# Patient Record
Sex: Female | Born: 1960 | Race: White | Hispanic: No | Marital: Married | Smoking: Never smoker
Health system: Southern US, Community
[De-identification: ages and names within clinical notes are randomized; demographics above are authoritative.]

## PROBLEM LIST (undated history)

## (undated) DIAGNOSIS — D649 Anemia, unspecified: Secondary | ICD-10-CM

## (undated) DIAGNOSIS — E669 Obesity, unspecified: Secondary | ICD-10-CM

## (undated) DIAGNOSIS — J45909 Unspecified asthma, uncomplicated: Secondary | ICD-10-CM

## (undated) DIAGNOSIS — I10 Essential (primary) hypertension: Secondary | ICD-10-CM

## (undated) DIAGNOSIS — K635 Polyp of colon: Secondary | ICD-10-CM

## (undated) DIAGNOSIS — E039 Hypothyroidism, unspecified: Secondary | ICD-10-CM

## (undated) DIAGNOSIS — K589 Irritable bowel syndrome without diarrhea: Secondary | ICD-10-CM

## (undated) DIAGNOSIS — K5790 Diverticulosis of intestine, part unspecified, without perforation or abscess without bleeding: Secondary | ICD-10-CM

## (undated) DIAGNOSIS — K8020 Calculus of gallbladder without cholecystitis without obstruction: Secondary | ICD-10-CM

## (undated) HISTORY — DX: Unspecified asthma, uncomplicated: J45.909

## (undated) HISTORY — DX: Diverticulosis of intestine, part unspecified, without perforation or abscess without bleeding: K57.90

## (undated) HISTORY — DX: Obesity, unspecified: E66.9

## (undated) HISTORY — PX: UMBILICAL HERNIA REPAIR: SHX196

## (undated) HISTORY — DX: Polyp of colon: K63.5

## (undated) HISTORY — DX: Essential (primary) hypertension: I10

## (undated) HISTORY — DX: Anemia, unspecified: D64.9

## (undated) HISTORY — DX: Irritable bowel syndrome, unspecified: K58.9

## (undated) HISTORY — PX: OTHER SURGICAL HISTORY: SHX169

## (undated) HISTORY — DX: Calculus of gallbladder without cholecystitis without obstruction: K80.20

## (undated) HISTORY — DX: Hypothyroidism, unspecified: E03.9

## (undated) HISTORY — PX: ETHMOIDECTOMY: SHX5197

---

## 1997-08-02 HISTORY — PX: CHOLECYSTECTOMY: SHX55

## 1997-08-02 HISTORY — PX: COLONOSCOPY: SHX174

## 1999-08-03 HISTORY — PX: GASTRIC BYPASS: SHX52

## 2001-08-02 HISTORY — PX: ABDOMINAL HYSTERECTOMY: SHX81

## 2002-08-02 HISTORY — PX: OTHER SURGICAL HISTORY: SHX169

## 2004-08-02 HISTORY — PX: OTHER SURGICAL HISTORY: SHX169

## 2008-03-22 ENCOUNTER — Emergency Department (HOSPITAL_COMMUNITY): Admission: EM | Admit: 2008-03-22 | Discharge: 2008-03-22 | Payer: Self-pay | Admitting: Emergency Medicine

## 2010-09-04 ENCOUNTER — Emergency Department (HOSPITAL_COMMUNITY)
Admission: EM | Admit: 2010-09-04 | Discharge: 2010-09-04 | Disposition: A | Discharge status: Home / Self Care | Payer: PRIVATE HEALTH INSURANCE | Attending: Emergency Medicine | Admitting: Emergency Medicine

## 2010-09-04 ENCOUNTER — Emergency Department (HOSPITAL_COMMUNITY)
Admit: 2010-09-04 | Discharge: 2010-09-04 | Disposition: A | Discharge status: Home / Self Care | Payer: PRIVATE HEALTH INSURANCE

## 2010-09-04 ENCOUNTER — Encounter (HOSPITAL_COMMUNITY): Payer: Self-pay

## 2010-09-04 DIAGNOSIS — M25579 Pain in unspecified ankle and joints of unspecified foot: Secondary | ICD-10-CM | POA: Insufficient documentation

## 2010-09-04 DIAGNOSIS — M545 Low back pain, unspecified: Secondary | ICD-10-CM | POA: Insufficient documentation

## 2010-09-04 DIAGNOSIS — M1710 Unilateral primary osteoarthritis, unspecified knee: Secondary | ICD-10-CM | POA: Insufficient documentation

## 2010-09-04 DIAGNOSIS — M25569 Pain in unspecified knee: Secondary | ICD-10-CM | POA: Insufficient documentation

## 2010-09-04 DIAGNOSIS — M25519 Pain in unspecified shoulder: Secondary | ICD-10-CM | POA: Insufficient documentation

## 2010-09-04 DIAGNOSIS — IMO0002 Reserved for concepts with insufficient information to code with codable children: Secondary | ICD-10-CM | POA: Insufficient documentation

## 2010-09-04 DIAGNOSIS — E039 Hypothyroidism, unspecified: Secondary | ICD-10-CM | POA: Insufficient documentation

## 2010-09-04 DIAGNOSIS — Z9889 Other specified postprocedural states: Secondary | ICD-10-CM | POA: Insufficient documentation

## 2010-09-04 DIAGNOSIS — M25469 Effusion, unspecified knee: Secondary | ICD-10-CM | POA: Insufficient documentation

## 2010-09-04 DIAGNOSIS — Z79899 Other long term (current) drug therapy: Secondary | ICD-10-CM | POA: Insufficient documentation

## 2012-01-16 IMAGING — CR DG LUMBAR SPINE COMPLETE 4+V
5 series · 5 of 5 positions shown · non-contrast
Comparison: None.

CLINICAL DATA: Low back pain

LUMBAR SPINE - COMPLETE 4+ VIEW

[view not recorded (1 of 5)]
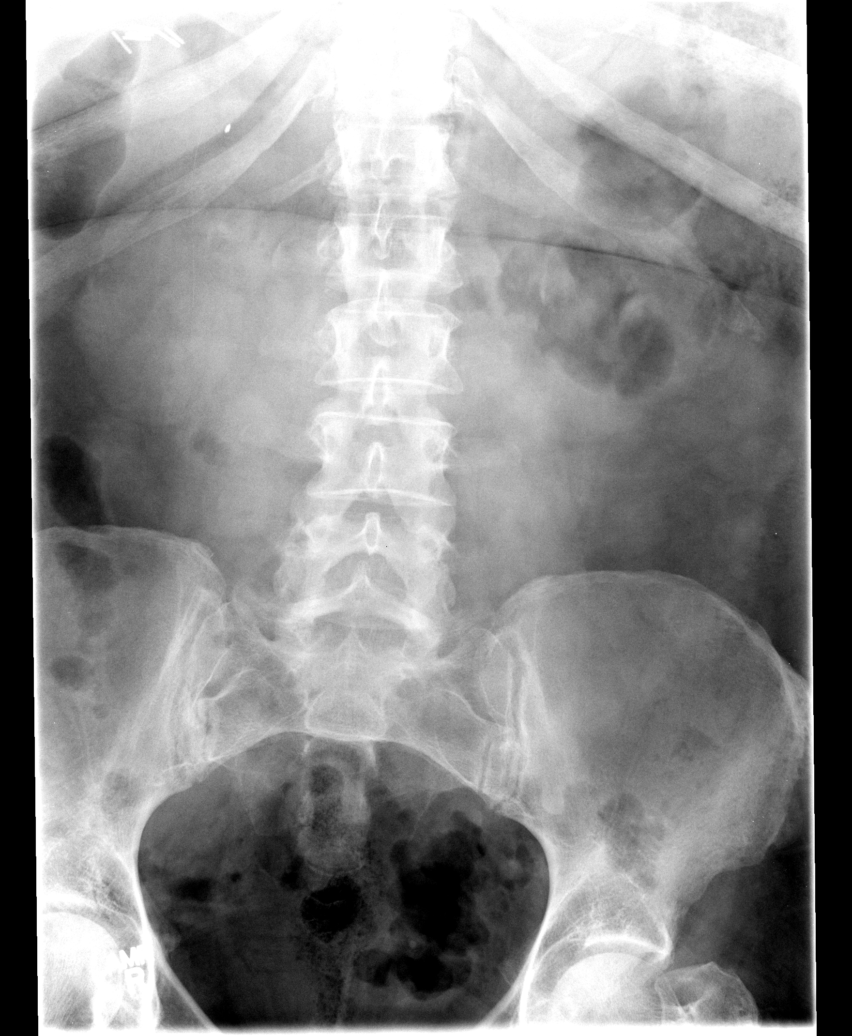

[view not recorded (2 of 5)]
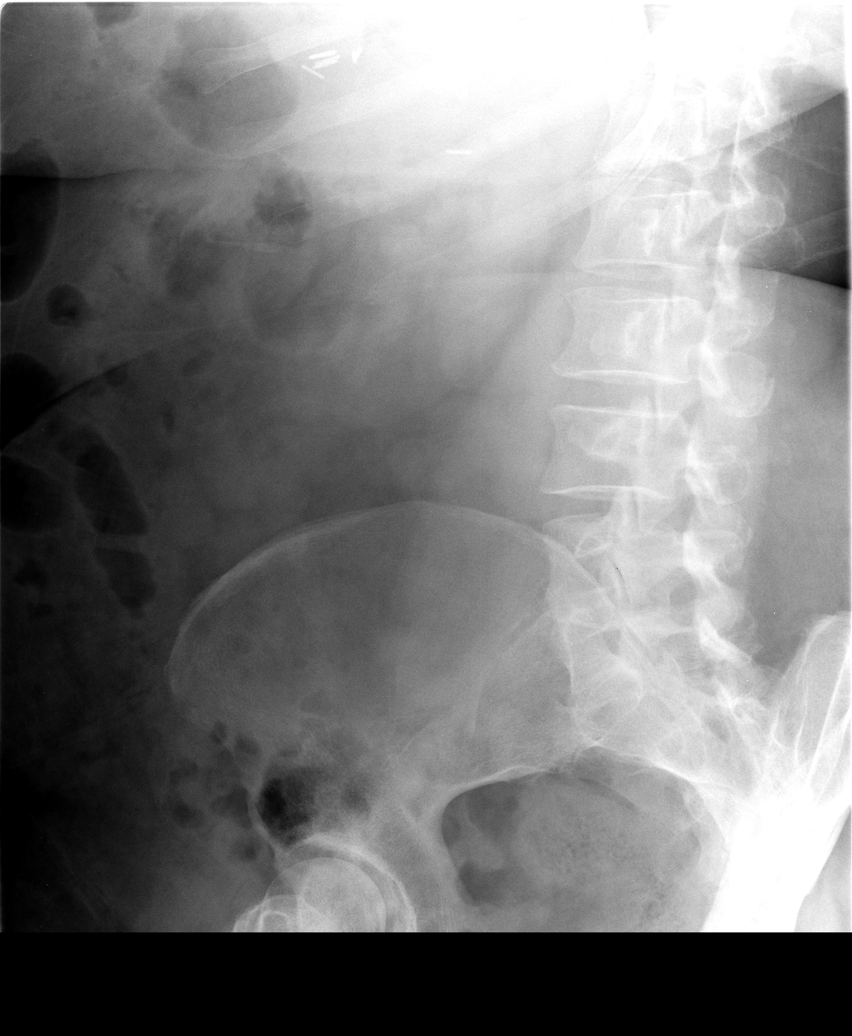

[view not recorded (3 of 5)]
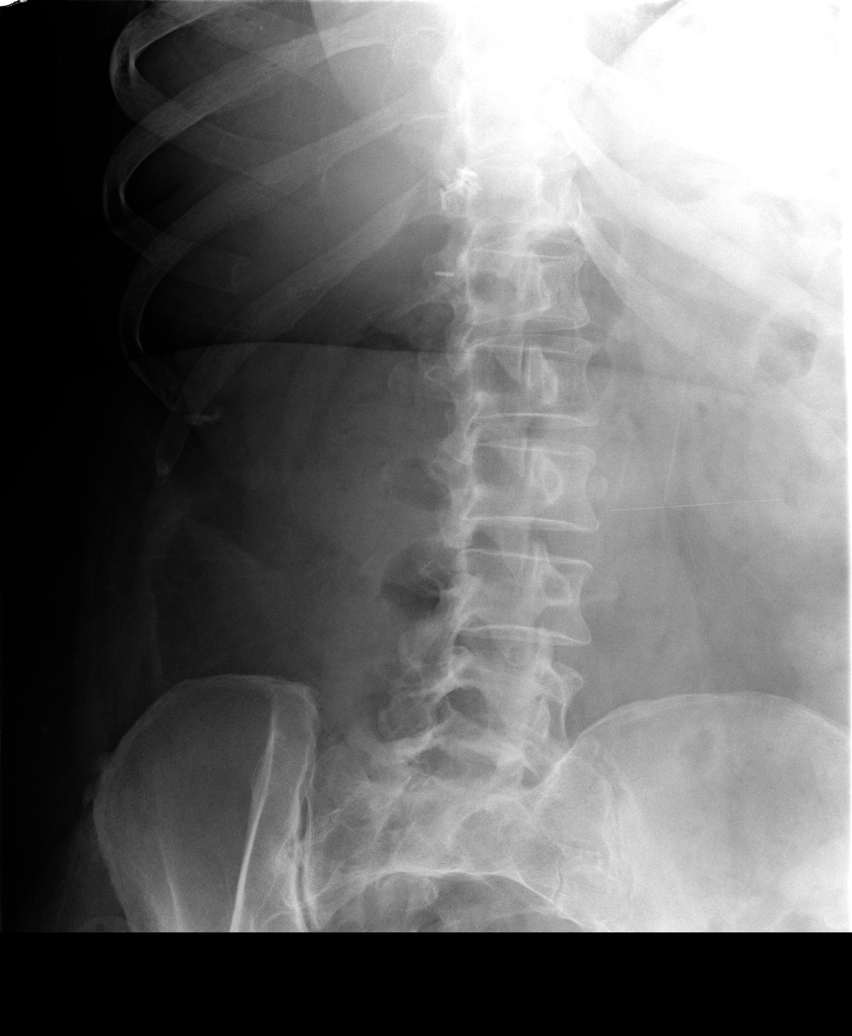

[view not recorded (4 of 5)]
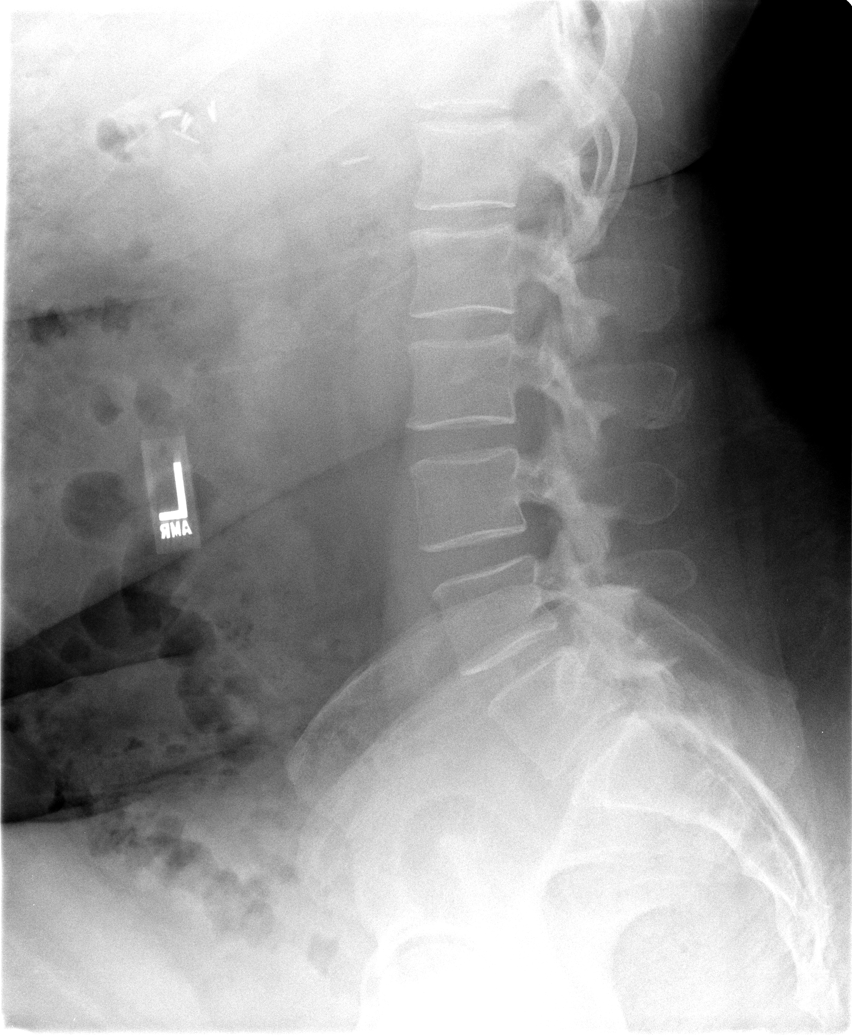

[view not recorded (5 of 5)]
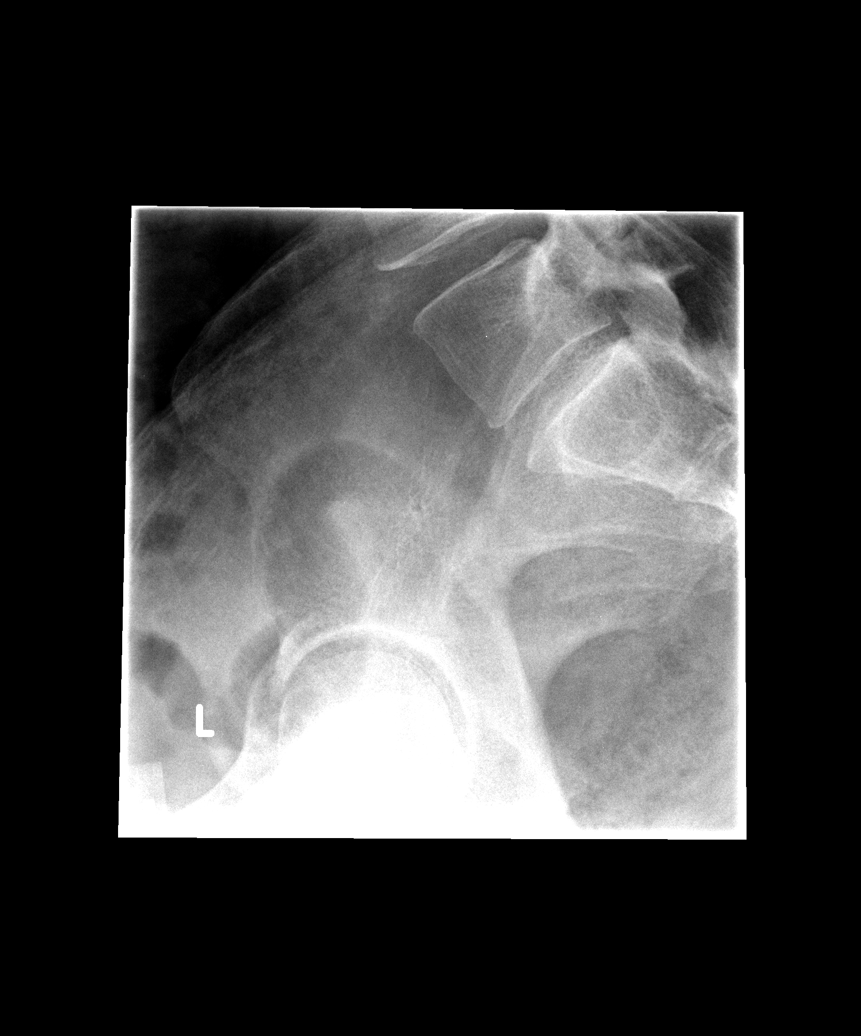

[5 of 5 positions shown; findings below may reference images not displayed]

FINDINGS: There is transitional lumbosacral anatomy.  No fracture
or subluxation.  Disc height within normal limits.  Soft tissues
unremarkable.  There are cholecystectomy clips.
IMPRESSION: No acute or significant findings.  There is transitional
lumbosacral anatomy.

## 2014-10-18 ENCOUNTER — Telehealth: Payer: Self-pay

## 2014-10-18 ENCOUNTER — Ambulatory Visit (AMBULATORY_SURGERY_CENTER): Payer: Self-pay

## 2014-10-18 VITALS — Ht 64.0 in | Wt 312.0 lb

## 2014-10-18 DIAGNOSIS — Z1211 Encounter for screening for malignant neoplasm of colon: Secondary | ICD-10-CM

## 2014-10-18 MED ORDER — MOVIPREP 100 G PO SOLR: 1.0000 | Freq: Once | ORAL | Status: DC

## 2014-10-18 NOTE — Progress Notes (Signed)
No allergies to eggs or soy No diet/weight loss meds No home oxygen No past problems with anesthesia  Has email  Emmi instructions given for colonoscopy 

## 2014-10-18 NOTE — Telephone Encounter (Signed)
Pt scheduled for OV.  

## 2014-10-18 NOTE — Telephone Encounter (Signed)
BMI > 53.  Needs hospital appt.  Already had previsit on 10/18/14.

## 2014-10-18 NOTE — Patient Instructions (Signed)
Patient experienced a burn prior to discharge. [phone])

## 2014-10-22 ENCOUNTER — Ambulatory Visit (INDEPENDENT_AMBULATORY_CARE_PROVIDER_SITE_OTHER): Payer: 59 | Admitting: Nurse Practitioner

## 2014-10-22 ENCOUNTER — Other Ambulatory Visit (INDEPENDENT_AMBULATORY_CARE_PROVIDER_SITE_OTHER): Payer: 59

## 2014-10-22 ENCOUNTER — Encounter: Payer: Self-pay | Admitting: Nurse Practitioner

## 2014-10-22 VITALS — BP 130/72 | HR 74 | Ht 64.0 in | Wt 313.8 lb

## 2014-10-22 DIAGNOSIS — R112 Nausea with vomiting, unspecified: Secondary | ICD-10-CM | POA: Diagnosis not present

## 2014-10-22 DIAGNOSIS — K219 Gastro-esophageal reflux disease without esophagitis: Secondary | ICD-10-CM | POA: Diagnosis not present

## 2014-10-22 DIAGNOSIS — R194 Change in bowel habit: Secondary | ICD-10-CM | POA: Insufficient documentation

## 2014-10-22 LAB — CBC WITH DIFFERENTIAL/PLATELET
BASOS ABS: 0 10*3/uL (ref 0.0–0.1)
BASOS PCT: 0.4 % (ref 0.0–3.0)
EOS PCT: 1.8 % (ref 0.0–5.0)
Eosinophils Absolute: 0.2 10*3/uL (ref 0.0–0.7)
HEMATOCRIT: 39.7 % (ref 36.0–46.0)
HEMOGLOBIN: 13.2 g/dL (ref 12.0–15.0)
LYMPHS PCT: 28.8 % (ref 12.0–46.0)
Lymphs Abs: 2.5 10*3/uL (ref 0.7–4.0)
MCHC: 33.2 g/dL (ref 30.0–36.0)
MCV: 80.4 fl (ref 78.0–100.0)
MONOS PCT: 4.7 % (ref 3.0–12.0)
Monocytes Absolute: 0.4 10*3/uL (ref 0.1–1.0)
NEUTROS ABS: 5.7 10*3/uL (ref 1.4–7.7)
Neutrophils Relative %: 64.3 % (ref 43.0–77.0)
Platelets: 319 10*3/uL (ref 150.0–400.0)
RBC: 4.93 Mil/uL (ref 3.87–5.11)
RDW: 14.7 % (ref 11.5–15.5)
WBC: 8.8 10*3/uL (ref 4.0–10.5)

## 2014-10-22 MED ORDER — OMEPRAZOLE 20 MG PO CPDR: 20.0000 mg | DELAYED_RELEASE_CAPSULE | Freq: Every day | ORAL | Status: DC

## 2014-10-22 NOTE — Progress Notes (Addendum)
HPI :   Patient is 54 year old female, referred by PCP for colon cancer screening. She is status post gastric bypass 2001,  Patient met with a previsit nurse and after realizing patient had some gastrointestinal complaints the patient was scheduled this office visit.   Patient gives a long-standing history of irritable bowel syndrome, predominantly diarrhea, especially since cholecystectomy in 1999. Over the last month patient has been having problems with constipation which is a new problem for her. Constipation is intermittent, she is still having intermittent diarrhea as usual. In addition to constipation patient has been having diffuse lower abdominal pain. Her lower abdomen feels tight, there is a band like discomfort across the lower abdomen. This discomfort is not related to meals, it is not relieved with defecation. No associated fevers. No urinary discomfort.  In addition to above patient has been having postprandial nausea and vomiting over the last month. This is occurring at least twice a week. This is a new problem for her. She takes meloxicam but no more than twice a week. She has a history of GERD but symptoms are usually seasonal. Lately she has been having increasing amounts of pyrosis and globus sensation. She does not take anything for acid reflux.   Past Medical History  Diagnosis Date  . Hypertension   . Thyroid activity decreased   . Anemia   . Asthma   . Irritable bowel syndrome   . Obesity   . Gallstones    Past Surgical History  Procedure Laterality Date  . Cholecystectomy  1999  . Abdominal hysterectomy  2003  . Gastric bypass  2001  . Lt ovary and fallopian tube  2006  . Excess skin removed  2004    abdominoplasty  . Sinus endoscope    . Ethmoidectomy    . Colonoscopy  1999  . Umbilical hernia repair      Family History  Problem Relation Age of Onset  . Colon cancer Neg Hx   . Colon polyps Mother   . Colon polyps Brother   . Diabetes Maternal  Grandmother    History  Substance Use Topics  . Smoking status: Never Smoker   . Smokeless tobacco: Never Used  . Alcohol Use: No   Current Outpatient Prescriptions  Medication Sig Dispense Refill  . amLODipine (NORVASC) 5 MG tablet Take 5 mg by mouth daily.    . hydrochlorothiazide (HYDRODIURIL) 25 MG tablet Take 25 mg by mouth daily.    Marland Kitchen levothyroxine (SYNTHROID, LEVOTHROID) 100 MCG tablet Take 100 mcg by mouth daily before breakfast.    . meloxicam (MOBIC) 15 MG tablet Take 15 mg by mouth as needed for pain.    Marland Kitchen MOVIPREP 100 G SOLR Take 1 kit (200 g total) by mouth once. 1 kit 0   No current facility-administered medications for this visit.   Allergies  Allergen Reactions  . Sulfa Antibiotics      Review of Systems: All systems reviewed and negative except where noted in HPI.    No results found.  Physical Exam: BP 130/72 mmHg  Pulse 74  Ht _0  (1.626 m)  Wt 313 lb 12.8 oz (142.339 kg)  BMI 53.84 kg/m2 Constitutional: Pleasant, obese white female in no acute distress. HEENT: Normocephalic and atraumatic. Conjunctivae are normal. No scleral icterus. Neck supple.  Cardiovascular: Normal rate, regular rhythm.  Pulmonary/chest: Effort normal and breath sounds normal. No wheezing, rales or rhonchi. Abdominal: Soft, nondistended, nontender. Bowel sounds active throughout. There are no masses palpable.  No hepatomegaly. Extremities: no edema Lymphadenopathy: No cervical adenopathy noted. Neurological: Alert and oriented to person place and time. Skin: Skin is warm and dry. No rashes noted. Psychiatric: Normal mood and affect. Behavior is normal.   ASSESSMENT AND PLAN:  1.  pleasant 54 year old female referred for colon cancer screening. She gives a one-month history of bowel changes as described in H&P but basically she has developed some problems with constipation and over the last week has developed diffuse lower abdominal pain. Her abdominal exam is not overly  concerning. She has been afebrile.   Will check a CBC. I doubt diverticulitis but if pain gets worse she will need CTscan to exclude diverticulitis before proceeding with colonoscopy. Otherwise will go ahead with colonoscopy. The risks, benefits, and alternatives to colonoscopy with possible biopsy and possible polypectomy were discussed with the patient and she consents to proceed.   Begin daily Miralax for constipation.  2  Postprandial nausea and vomiting for approximately 1 month now. Patient takes meloxicam about twice a week. She is not on a PPI. Peptic ulcer disease should be excluded. Patient is status post remote gastric bypass.   Doubt obstructive process.    For further evaluation patient will be scheduled for upper endoscopy to be done at same time as colonoscopy. Given BMI these procedures will need to be done at the hospital.  3. GERD, having increasing pyrosis and globus sensation. Will start her on daily omeprazole. GERD literature given.  Addendum: Reviewed and agree with initial management. Jerene Bears, MD

## 2014-10-22 NOTE — Patient Instructions (Addendum)
Your physician has requested that you go to the basement for the following lab work before leaving today: CBC/diff   Take Miralax daily as needed, coupon provided.  We are giving you GERD information to read and follow today.   We have sent the following medications to your pharmacy for you to pick up at your convenience: Omeprazole   You have been scheduled for an endoscopy and colonoscopy. Please follow the written instructions given to you at your visit today. Please pick up your prep supplies at the pharmacy within the next 1-3 days. If you use inhalers (even only as needed), please bring them with you on the day of your procedure.  I appreciate the opportunity to care for you.

## 2014-10-31 ENCOUNTER — Encounter: Payer: PRIVATE HEALTH INSURANCE | Admitting: Internal Medicine

## 2014-11-04 ENCOUNTER — Telehealth: Payer: Self-pay | Admitting: *Deleted

## 2014-11-04 NOTE — Telephone Encounter (Signed)
Per Dr Vena Rua request, patient's 8:30 am appointment for endo/colon at [facility] on 11/21/14 has been changed to an 11:30 am case on 11/21/14. Patient verbalizes understanding of this and has been instructed of new time to arrive at hospital as well as new time to drink 2nd part of her Moviprep.

## 2014-11-08 ENCOUNTER — Encounter (HOSPITAL_COMMUNITY): Payer: Self-pay | Admitting: *Deleted

## 2014-11-20 NOTE — Anesthesia Preprocedure Evaluation (Signed)
Anesthesia Evaluation  Patient identified by MRN, date of birth, ID band Patient awake    Reviewed: Allergy & Precautions, NPO status , Patient's Chart, lab work & pertinent test results  History of Anesthesia Complications Negative for: history of anesthetic complications  Airway Mallampati: III  TM Distance: >3 FB Neck ROM: Full    Dental no notable dental hx. (+) Dental Advisory Given   Pulmonary asthma ,  breath sounds clear to auscultation  Pulmonary exam normal       Cardiovascular hypertension, Pt. on medications Rhythm:Regular Rate:Normal     Neuro/Psych negative neurological ROS  negative psych ROS   GI/Hepatic Neg liver ROS, GERD-  Medicated and Controlled,  Endo/Other  Hypothyroidism Morbid obesity  Renal/GU negative Renal ROS  negative genitourinary   Musculoskeletal negative musculoskeletal ROS (+)   Abdominal (+) + obese,   Peds negative pediatric ROS (+)  Hematology negative hematology ROS (+) anemia ,   Anesthesia Other Findings   Reproductive/Obstetrics negative OB ROS                             Anesthesia Physical Anesthesia Plan  ASA: III  Anesthesia Plan: MAC   Post-op Pain Management:    Induction: Intravenous  Airway Management Planned: Nasal Cannula  Additional Equipment:   Intra-op Plan:   Post-operative Plan:   Informed Consent: I have reviewed the patients History and Physical, chart, labs and discussed the procedure including the risks, benefits and alternatives for the proposed anesthesia with the patient or authorized representative who has indicated his/her understanding and acceptance.   Dental advisory given  Plan Discussed with: CRNA  Anesthesia Plan Comments:         Anesthesia Quick Evaluation

## 2014-11-21 ENCOUNTER — Ambulatory Visit (HOSPITAL_COMMUNITY): Payer: 59 | Admitting: Anesthesiology

## 2014-11-21 ENCOUNTER — Encounter (HOSPITAL_COMMUNITY): Payer: Self-pay

## 2014-11-21 ENCOUNTER — Ambulatory Visit (HOSPITAL_COMMUNITY)
Admission: RE | Admit: 2014-11-21 | Discharge: 2014-11-21 | Disposition: A | Discharge status: Home / Self Care | Payer: 59 | Source: Ambulatory Visit | Attending: Internal Medicine | Admitting: Internal Medicine

## 2014-11-21 ENCOUNTER — Encounter (HOSPITAL_COMMUNITY)
Admission: RE | Disposition: A | Discharge status: Home / Self Care | Payer: Self-pay | Source: Ambulatory Visit | Attending: Internal Medicine

## 2014-11-21 DIAGNOSIS — R0989 Other specified symptoms and signs involving the circulatory and respiratory systems: Secondary | ICD-10-CM | POA: Insufficient documentation

## 2014-11-21 DIAGNOSIS — K635 Polyp of colon: Secondary | ICD-10-CM | POA: Insufficient documentation

## 2014-11-21 DIAGNOSIS — E039 Hypothyroidism, unspecified: Secondary | ICD-10-CM | POA: Diagnosis not present

## 2014-11-21 DIAGNOSIS — Z79899 Other long term (current) drug therapy: Secondary | ICD-10-CM | POA: Insufficient documentation

## 2014-11-21 DIAGNOSIS — R112 Nausea with vomiting, unspecified: Secondary | ICD-10-CM | POA: Diagnosis not present

## 2014-11-21 DIAGNOSIS — K5730 Diverticulosis of large intestine without perforation or abscess without bleeding: Secondary | ICD-10-CM | POA: Diagnosis not present

## 2014-11-21 DIAGNOSIS — Z9884 Bariatric surgery status: Secondary | ICD-10-CM | POA: Insufficient documentation

## 2014-11-21 DIAGNOSIS — K621 Rectal polyp: Secondary | ICD-10-CM | POA: Diagnosis not present

## 2014-11-21 DIAGNOSIS — K5900 Constipation, unspecified: Secondary | ICD-10-CM | POA: Insufficient documentation

## 2014-11-21 DIAGNOSIS — Z6841 Body Mass Index (BMI) 40.0 and over, adult: Secondary | ICD-10-CM | POA: Diagnosis not present

## 2014-11-21 DIAGNOSIS — R1030 Lower abdominal pain, unspecified: Secondary | ICD-10-CM | POA: Insufficient documentation

## 2014-11-21 DIAGNOSIS — R09A2 Foreign body sensation, throat: Secondary | ICD-10-CM | POA: Insufficient documentation

## 2014-11-21 DIAGNOSIS — D122 Benign neoplasm of ascending colon: Secondary | ICD-10-CM | POA: Diagnosis not present

## 2014-11-21 DIAGNOSIS — K219 Gastro-esophageal reflux disease without esophagitis: Secondary | ICD-10-CM

## 2014-11-21 DIAGNOSIS — D128 Benign neoplasm of rectum: Secondary | ICD-10-CM | POA: Insufficient documentation

## 2014-11-21 DIAGNOSIS — D127 Benign neoplasm of rectosigmoid junction: Secondary | ICD-10-CM

## 2014-11-21 DIAGNOSIS — E669 Obesity, unspecified: Secondary | ICD-10-CM | POA: Insufficient documentation

## 2014-11-21 DIAGNOSIS — I10 Essential (primary) hypertension: Secondary | ICD-10-CM | POA: Diagnosis not present

## 2014-11-21 DIAGNOSIS — R194 Change in bowel habit: Secondary | ICD-10-CM | POA: Diagnosis not present

## 2014-11-21 DIAGNOSIS — F458 Other somatoform disorders: Secondary | ICD-10-CM | POA: Diagnosis not present

## 2014-11-21 DIAGNOSIS — Z1211 Encounter for screening for malignant neoplasm of colon: Secondary | ICD-10-CM

## 2014-11-21 HISTORY — PX: ESOPHAGOGASTRODUODENOSCOPY: SHX5428

## 2014-11-21 HISTORY — PX: COLONOSCOPY WITH PROPOFOL: SHX5780

## 2014-11-21 SURGERY — COLONOSCOPY WITH PROPOFOL
Anesthesia: Monitor Anesthesia Care

## 2014-11-21 MED ORDER — ONDANSETRON HCL 4 MG/2ML IJ SOLN
INTRAMUSCULAR | Status: DC | PRN
  Administered 2014-11-21: 4 mg via INTRAVENOUS

## 2014-11-21 MED ORDER — LIDOCAINE HCL (CARDIAC) 20 MG/ML IV SOLN
INTRAVENOUS | Status: DC | PRN
  Administered 2014-11-21: 100 mg via INTRAVENOUS

## 2014-11-21 MED ORDER — LACTATED RINGERS IV SOLN
INTRAVENOUS | Status: DC
  Administered 2014-11-21: 1000 mL via INTRAVENOUS

## 2014-11-21 MED ORDER — SODIUM CHLORIDE 0.9 % IV SOLN: INTRAVENOUS | Status: DC

## 2014-11-21 MED ORDER — ONDANSETRON HCL 4 MG/2ML IJ SOLN
INTRAMUSCULAR | Status: AC
  Filled 2014-11-21: qty 2

## 2014-11-21 MED ORDER — PROPOFOL 10 MG/ML IV BOLUS
INTRAVENOUS | Status: AC
Start: 2014-11-21 — End: 2014-11-21
  Filled 2014-11-21: qty 20

## 2014-11-21 MED ORDER — PROPOFOL 10 MG/ML IV BOLUS
INTRAVENOUS | Status: AC
  Filled 2014-11-21: qty 20

## 2014-11-21 MED ORDER — KETAMINE HCL 10 MG/ML IJ SOLN
INTRAMUSCULAR | Status: DC | PRN
  Administered 2014-11-21: 20 mg via INTRAVENOUS

## 2014-11-21 MED ORDER — MIDAZOLAM HCL 5 MG/5ML IJ SOLN
INTRAMUSCULAR | Status: DC | PRN
  Administered 2014-11-21: 2 mg via INTRAVENOUS

## 2014-11-21 MED ORDER — HYOSCYAMINE SULFATE 0.125 MG SL SUBL: 0.1250 mg | SUBLINGUAL_TABLET | SUBLINGUAL | Status: AC | PRN

## 2014-11-21 MED ORDER — BUTAMBEN-TETRACAINE-BENZOCAINE 2-2-14 % EX AERO
INHALATION_SPRAY | CUTANEOUS | Status: DC | PRN
  Administered 2014-11-21: 2 via TOPICAL

## 2014-11-21 MED ORDER — PROPOFOL INFUSION 10 MG/ML OPTIME
INTRAVENOUS | Status: DC | PRN
  Administered 2014-11-21: 300 ug/kg/min via INTRAVENOUS

## 2014-11-21 MED ORDER — LIDOCAINE HCL (CARDIAC) 20 MG/ML IV SOLN
INTRAVENOUS | Status: AC
  Filled 2014-11-21: qty 5

## 2014-11-21 MED ORDER — MIDAZOLAM HCL 2 MG/2ML IJ SOLN
INTRAMUSCULAR | Status: AC
Start: 2014-11-21 — End: 2014-11-21
  Filled 2014-11-21: qty 2

## 2014-11-21 SURGICAL SUPPLY — 21 items

## 2014-11-21 NOTE — Transfer of Care (Signed)
Immediate Anesthesia Transfer of Care Note  Patient: Kimberly Moody  Procedure(s) Performed: Procedure(s): COLONOSCOPY WITH PROPOFOL (N/A) ESOPHAGOGASTRODUODENOSCOPY (EGD) (N/A)  Patient Location: PACU  Anesthesia Type:MAC  Level of Consciousness:  sedated, patient cooperative and responds to stimulation  Airway & Oxygen Therapy:Patient Spontanous Breathing and Patient connected to face mask oxgen  Post-op Assessment:  Report given to PACU RN and Post -op Vital signs reviewed and stable  Post vital signs:  Reviewed and stable  Last Vitals:  Filed Vitals:   11/21/14 1025  BP: 145/84  Pulse: 72  Temp: 36.8 C  Resp: 15    Complications: No apparent anesthesia complications

## 2014-11-21 NOTE — Discharge Instructions (Signed)

## 2014-11-21 NOTE — Interval H&P Note (Signed)
History and Physical Interval Note Patient presents for upper endoscopy and colonoscopy to evaluate globus and pyrosis symptoms. Also for colorectal cancer screening evaluation of lower abdominal pain and change in bowel habit Procedure to be performed with monitored anesthesia care The nature of the procedure, as well as the risks, benefits, and alternatives were carefully and thoroughly reviewed with the patient. Ample time for discussion and questions allowed. The patient understood, was satisfied, and agreed to proceed.     11/21/2014 11:30 AM  Kimberly Moody  has presented today for surgery, with the diagnosis of Altered bowel habits, nausea and vomiting, GERD  The various methods of treatment have been discussed with the patient and family. After consideration of risks, benefits and other options for treatment, the patient has consented to  Procedure(s): COLONOSCOPY WITH PROPOFOL (N/A) ESOPHAGOGASTRODUODENOSCOPY (EGD) (N/A) as a surgical intervention .  The patient's history has been reviewed, patient examined, no change in status, stable for surgery.  I have reviewed the patient's chart and labs.  Questions were answered to the patient's satisfaction.     [name]

## 2014-11-21 NOTE — Op Note (Signed)
[facility] [place], [address]   COLONOSCOPY PROCEDURE REPORT  PATIENT: Kimberly, Moody  MR#: [id number] BIRTHDATE: [birth year] , 70  yrs. old GENDER: female ENDOSCOPIST: Jerene Bears, MD PROCEDURE DATE:  11/21/2014 PROCEDURE:   Colonoscopy with snare polypectomy First Screening Colonoscopy - Avg.  risk and is 50 yrs.  old or older Yes.  Prior Negative Screening - Now for repeat screening. N/A  History of Adenoma - Now for follow-up colonoscopy & has been > or = to 3 yrs.  N/A ASA CLASS:   Class III INDICATIONS:average risk patient for colon cancer and alternating bowel habits, lower abdominal discomfort. MEDICATIONS: Monitored anesthesia care and Per Anesthesia  DESCRIPTION OF PROCEDURE:   After the risks benefits and alternatives of the procedure were thoroughly explained, informed consent was obtained.  The digital rectal exam revealed no rectal mass.   The Pentax Adult Colonoscope [id number]  endoscope was introduced through the anus and advanced to the cecum, which was identified by both the appendix and ileocecal valve. No adverse events experienced.   The quality of the prep was good.  (MoviPrep was used)  The instrument was then slowly withdrawn as the colon was fully examined.  COLON FINDINGS: A sessile polyp measuring 8 mm in size with a mucous cap was found in the ascending colon.  A polypectomy was performed using snare cautery.  The resection was complete, the polyp tissue was completely retrieved and sent to histology.   There was moderate diverticulosis noted in the descending colon and sigmoid colon with associated muscular hypertrophy.   Two sessile polyps ranging between 3-77mm in size were found in the rectosigmoid colon and rectum.  Polypectomies were performed with cold forceps (1) and with a cold snare (1).  The resection was complete, the polyp tissue was completely retrieved and sent to histology.  Retroflexed views revealed  no other abnormalities. The time to cecum = 3.1 Withdrawal time = 18.2   The scope was withdrawn and the procedure completed. COMPLICATIONS: There were no immediate complications.  ENDOSCOPIC IMPRESSION: 1.   Sessile polyp was found in the ascending colon; polypectomy was performed using snare cautery 2.   There was moderate diverticulosis noted in the descending colon and sigmoid colon 3.   Two sessile polyps ranging between 3-45mm in size were found in the rectosigmoid colon and rectum; polypectomies were performed with cold forceps and with a cold snare  RECOMMENDATIONS: 1.  Hold Aspirin and all other NSAIDs for 2 weeks. 2.  Await pathology results 3.  Trial of Benefiber 1-2 tablespoons daily.  Begin Florastor 250 mg twice daily.  Levsin 0.125 1-2 tabs every 4-6 hours as needed for lower abdominal cramping pain 4.  Office follow-up with me  eSigned:  Jerene Bears, MD 11/21/2014 12:55 PM reviscc:  the patient, PCP

## 2014-11-21 NOTE — Anesthesia Postprocedure Evaluation (Signed)
  Anesthesia Post-op Note  Patient: KEYLEN UZELAC  Procedure(s) Performed: Procedure(s) (LRB): COLONOSCOPY WITH PROPOFOL (N/A) ESOPHAGOGASTRODUODENOSCOPY (EGD) (N/A)  Patient Location: PACU  Anesthesia Type: MAC  Level of Consciousness: awake and alert   Airway and Oxygen Therapy: Patient Spontanous Breathing  Post-op Pain: mild  Post-op Assessment: Post-op Vital signs reviewed, Patient's Cardiovascular Status Stable, Respiratory Function Stable, Patent Airway and No signs of Nausea or vomiting  Last Vitals:  Filed Vitals:   11/21/14 1255  BP:   Pulse: 59  Temp: 36.5 C  Resp: 19    Post-op Vital Signs: stable   Complications: No apparent anesthesia complications

## 2014-11-21 NOTE — H&P (View-Only) (Signed)
  HPI :   Patient is 53-year-old female, referred by PCP for colon cancer screening. She is status post gastric bypass 2001,  Patient met with a previsit nurse and after realizing patient had some gastrointestinal complaints the patient was scheduled this office visit.   Patient gives a long-standing history of irritable bowel syndrome, predominantly diarrhea, especially since cholecystectomy in 1999. Over the last month patient has been having problems with constipation which is a new problem for her. Constipation is intermittent, she is still having intermittent diarrhea as usual. In addition to constipation patient has been having diffuse lower abdominal pain. Her lower abdomen feels tight, there is a band like discomfort across the lower abdomen. This discomfort is not related to meals, it is not relieved with defecation. No associated fevers. No urinary discomfort.  In addition to above patient has been having postprandial nausea and vomiting over the last month. This is occurring at least twice a week. This is a new problem for her. She takes meloxicam but no more than twice a week. She has a history of GERD but symptoms are usually seasonal. Lately she has been having increasing amounts of pyrosis and globus sensation. She does not take anything for acid reflux.   Past Medical History  Diagnosis Date  . Hypertension   . Thyroid activity decreased   . Anemia   . Asthma   . Irritable bowel syndrome   . Obesity   . Gallstones    Past Surgical History  Procedure Laterality Date  . Cholecystectomy  1999  . Abdominal hysterectomy  2003  . Gastric bypass  2001  . Lt ovary and fallopian tube  2006  . Excess skin removed  2004    abdominoplasty  . Sinus endoscope    . Ethmoidectomy    . Colonoscopy  1999  . Umbilical hernia repair      Family History  Problem Relation Age of Onset  . Colon cancer Neg Hx   . Colon polyps Mother   . Colon polyps Brother   . Diabetes Maternal  Grandmother    History  Substance Use Topics  . Smoking status: Never Smoker   . Smokeless tobacco: Never Used  . Alcohol Use: No   Current Outpatient Prescriptions  Medication Sig Dispense Refill  . amLODipine (NORVASC) 5 MG tablet Take 5 mg by mouth daily.    . hydrochlorothiazide (HYDRODIURIL) 25 MG tablet Take 25 mg by mouth daily.    . levothyroxine (SYNTHROID, LEVOTHROID) 100 MCG tablet Take 100 mcg by mouth daily before breakfast.    . meloxicam (MOBIC) 15 MG tablet Take 15 mg by mouth as needed for pain.    . MOVIPREP 100 G SOLR Take 1 kit (200 g total) by mouth once. 1 kit 0   No current facility-administered medications for this visit.   Allergies  Allergen Reactions  . Sulfa Antibiotics      Review of Systems: All systems reviewed and negative except where noted in HPI.    No results found.  Physical Exam: BP 130/72 mmHg  Pulse 74  Ht 5' 4" (1.626 m)  Wt 313 lb 12.8 oz (142.339 kg)  BMI 53.84 kg/m2 Constitutional: Pleasant, obese white female in no acute distress. HEENT: Normocephalic and atraumatic. Conjunctivae are normal. No scleral icterus. Neck supple.  Cardiovascular: Normal rate, regular rhythm.  Pulmonary/chest: Effort normal and breath sounds normal. No wheezing, rales or rhonchi. Abdominal: Soft, nondistended, nontender. Bowel sounds active throughout. There are no masses palpable.   No hepatomegaly. Extremities: no edema Lymphadenopathy: No cervical adenopathy noted. Neurological: Alert and oriented to person place and time. Skin: Skin is warm and dry. No rashes noted. Psychiatric: Normal mood and affect. Behavior is normal.   ASSESSMENT AND PLAN:  1.  pleasant 53-year-old female referred for colon cancer screening. She gives a one-month history of bowel changes as described in H&P but basically she has developed some problems with constipation and over the last week has developed diffuse lower abdominal pain. Her abdominal exam is not overly  concerning. She has been afebrile.   Will check a CBC. I doubt diverticulitis but if pain gets worse she will need CTscan to exclude diverticulitis before proceeding with colonoscopy. Otherwise will go ahead with colonoscopy. The risks, benefits, and alternatives to colonoscopy with possible biopsy and possible polypectomy were discussed with the patient and she consents to proceed.   Begin daily Miralax for constipation.  2  Postprandial nausea and vomiting for approximately 1 month now. Patient takes meloxicam about twice a week. She is not on a PPI. Peptic ulcer disease should be excluded. Patient is status post remote gastric bypass.   Doubt obstructive process.    For further evaluation patient will be scheduled for upper endoscopy to be done at same time as colonoscopy. Given BMI these procedures will need to be done at the hospital.  3. GERD, having increasing pyrosis and globus sensation. Will start her on daily omeprazole. GERD literature given.  Addendum: Reviewed and agree with initial management. Jay M Pyrtle, MD   

## 2014-11-22 ENCOUNTER — Encounter (HOSPITAL_COMMUNITY): Payer: Self-pay | Admitting: Internal Medicine

## 2014-11-22 NOTE — Op Note (Signed)
[facility] [place], [address]   ENDOSCOPY PROCEDURE REPORT  PATIENT: Kimberly Moody, Kimberly Moody  MR#: [id number] BIRTHDATE: Jul 21, 1961 , 15  yrs. old GENDER: female ENDOSCOPIST: Jerene Bears, MD PROCEDURE DATE:  11/21/2014 PROCEDURE:  EGD, diagnostic ASA CLASS:     Class III INDICATIONS:  nausea, vomiting, and history of GERD. MEDICATIONS: Monitored anesthesia care and Per Anesthesia TOPICAL ANESTHETIC: Cetacaine Spray  DESCRIPTION OF PROCEDURE: After the risks benefits and alternatives of the procedure were thoroughly explained, informed consent was obtained.  The standard adult upper endoscope was introduced through the mouth and advanced to the proximal jejunum , Without limitations.  The instrument was slowly withdrawn as the mucosa was fully examined.   ESOPHAGUS: The mucosa of the esophagus appeared normal.   Z line regular 40 cm  STOMACH: A Roux -en-Y anastomosis was found characterized as healthy in appearance.   Gastric pouch measures 7-8 cm and is normal in appearance.  JEJUNUM: The gastrojejunal anastomosis is patent and healthy appearing. The efferent jejunal limb appeared normal. Retroflexed views revealed no abnormalities.     The scope was then withdrawn from the patient and the procedure completed.  COMPLICATIONS: There were no immediate complications.  ENDOSCOPIC IMPRESSION: 1.   The mucosa of the esophagus appeared normal 2.   Roux -en-Y anastomosis was found, healthy appearance. Patent gastrojejunal anastomosis 3.   The exam showed no abnormalities in the efferent jejunum  RECOMMENDATIONS: 1.  Trial of pantoprazole 40 mg daily (best taken 30 minutes before breakfast) 2.  Proceed with colonoscopy  eSigned:  Jerene Bears, MD 11/21/2014 12:50 PM    CC: the patient, PCP

## 2014-11-25 ENCOUNTER — Encounter: Payer: Self-pay | Admitting: Internal Medicine

## 2014-12-09 ENCOUNTER — Telehealth: Payer: Self-pay | Admitting: Internal Medicine

## 2014-12-12 NOTE — Telephone Encounter (Signed)
Discussed with patient, FMLA paperwork completed for 3 days of missed work due to GI illness and procedures

## 2014-12-19 ENCOUNTER — Encounter: Payer: Self-pay | Admitting: *Deleted

## 2015-01-21 ENCOUNTER — Ambulatory Visit: Payer: 59 | Admitting: Internal Medicine

## 2015-03-07 NOTE — Telephone Encounter (Signed)
This encounter was created in error - please disregard.

## 2015-03-17 ENCOUNTER — Ambulatory Visit: Payer: 59 | Admitting: Internal Medicine

## 2015-03-26 ENCOUNTER — Emergency Department (HOSPITAL_COMMUNITY)
Admission: EM | Admit: 2015-03-26 | Discharge: 2015-03-26 | Disposition: A | Discharge status: Home / Self Care | Payer: 59 | Attending: Emergency Medicine | Admitting: Emergency Medicine

## 2015-03-26 ENCOUNTER — Encounter (HOSPITAL_COMMUNITY): Payer: Self-pay | Admitting: Emergency Medicine

## 2015-03-26 ENCOUNTER — Emergency Department (HOSPITAL_COMMUNITY): Payer: 59

## 2015-03-26 DIAGNOSIS — R110 Nausea: Secondary | ICD-10-CM | POA: Insufficient documentation

## 2015-03-26 DIAGNOSIS — Z862 Personal history of diseases of the blood and blood-forming organs and certain disorders involving the immune mechanism: Secondary | ICD-10-CM | POA: Diagnosis not present

## 2015-03-26 DIAGNOSIS — I10 Essential (primary) hypertension: Secondary | ICD-10-CM | POA: Diagnosis not present

## 2015-03-26 DIAGNOSIS — Z79899 Other long term (current) drug therapy: Secondary | ICD-10-CM | POA: Insufficient documentation

## 2015-03-26 DIAGNOSIS — R42 Dizziness and giddiness: Secondary | ICD-10-CM | POA: Insufficient documentation

## 2015-03-26 DIAGNOSIS — Z86010 Personal history of colonic polyps: Secondary | ICD-10-CM | POA: Diagnosis not present

## 2015-03-26 DIAGNOSIS — K589 Irritable bowel syndrome without diarrhea: Secondary | ICD-10-CM | POA: Insufficient documentation

## 2015-03-26 DIAGNOSIS — J45909 Unspecified asthma, uncomplicated: Secondary | ICD-10-CM | POA: Insufficient documentation

## 2015-03-26 DIAGNOSIS — R079 Chest pain, unspecified: Secondary | ICD-10-CM | POA: Diagnosis not present

## 2015-03-26 DIAGNOSIS — R531 Weakness: Secondary | ICD-10-CM | POA: Diagnosis present

## 2015-03-26 DIAGNOSIS — E669 Obesity, unspecified: Secondary | ICD-10-CM | POA: Diagnosis not present

## 2015-03-26 DIAGNOSIS — E876 Hypokalemia: Secondary | ICD-10-CM | POA: Diagnosis not present

## 2015-03-26 DIAGNOSIS — E039 Hypothyroidism, unspecified: Secondary | ICD-10-CM | POA: Diagnosis not present

## 2015-03-26 LAB — CBC WITH DIFFERENTIAL/PLATELET
BASOS ABS: 0 10*3/uL (ref 0.0–0.1)
Basophils Relative: 1 % (ref 0–1)
EOS ABS: 0.1 10*3/uL (ref 0.0–0.7)
EOS PCT: 1 % (ref 0–5)
HCT: 40.2 % (ref 36.0–46.0)
HEMOGLOBIN: 13.2 g/dL (ref 12.0–15.0)
Lymphocytes Relative: 17 % (ref 12–46)
Lymphs Abs: 1.4 10*3/uL (ref 0.7–4.0)
MCH: 27.5 pg (ref 26.0–34.0)
MCHC: 32.8 g/dL (ref 30.0–36.0)
MCV: 83.8 fL (ref 78.0–100.0)
Monocytes Absolute: 0.3 10*3/uL (ref 0.1–1.0)
Monocytes Relative: 4 % (ref 3–12)
Neutro Abs: 6.2 10*3/uL (ref 1.7–7.7)
Neutrophils Relative %: 77 % (ref 43–77)
PLATELETS: 299 10*3/uL (ref 150–400)
RBC: 4.8 MIL/uL (ref 3.87–5.11)
RDW: 13.8 % (ref 11.5–15.5)
WBC: 8 10*3/uL (ref 4.0–10.5)

## 2015-03-26 LAB — COMPREHENSIVE METABOLIC PANEL
ALT: 13 U/L — ABNORMAL LOW (ref 14–54)
AST: 16 U/L (ref 15–41)
Albumin: 3.9 g/dL (ref 3.5–5.0)
Alkaline Phosphatase: 83 U/L (ref 38–126)
Anion gap: 9 (ref 5–15)
BUN: 19 mg/dL (ref 6–20)
CALCIUM: 8.7 mg/dL — AB (ref 8.9–10.3)
CHLORIDE: 98 mmol/L — AB (ref 101–111)
CO2: 31 mmol/L (ref 22–32)
Creatinine, Ser: 0.93 mg/dL (ref 0.44–1.00)
GFR calc non Af Amer: >60 mL/min (ref >60)
GLUCOSE: 104 mg/dL — AB (ref 65–99)
Potassium: 3.1 mmol/L — ABNORMAL LOW (ref 3.5–5.1)
SODIUM: 138 mmol/L (ref 135–145)
Total Bilirubin: 0.8 mg/dL (ref 0.3–1.2)
Total Protein: 7.9 g/dL (ref 6.5–8.1)

## 2015-03-26 LAB — URINALYSIS, ROUTINE W REFLEX MICROSCOPIC
BILIRUBIN URINE: NEGATIVE
Glucose, UA: NEGATIVE mg/dL
Ketones, ur: NEGATIVE mg/dL
Leukocytes, UA: NEGATIVE
Nitrite: NEGATIVE
PH: 6 (ref 5.0–8.0)
Protein, ur: NEGATIVE mg/dL
SPECIFIC GRAVITY, URINE: 1.01 (ref 1.005–1.030)
UROBILINOGEN UA: 0.2 mg/dL (ref 0.0–1.0)

## 2015-03-26 LAB — TROPONIN I

## 2015-03-26 LAB — URINE MICROSCOPIC-ADD ON

## 2015-03-26 MED ORDER — POTASSIUM CHLORIDE CRYS ER 20 MEQ PO TBCR
40.0000 meq | EXTENDED_RELEASE_TABLET | Freq: Once | ORAL | Status: AC
  Administered 2015-03-26: 40 meq via ORAL
  Filled 2015-03-26: qty 2

## 2015-03-26 MED ORDER — MECLIZINE HCL 12.5 MG PO TABS
25.0000 mg | ORAL_TABLET | Freq: Once | ORAL | Status: AC
  Administered 2015-03-26: 25 mg via ORAL
  Filled 2015-03-26: qty 2

## 2015-03-26 MED ORDER — ASPIRIN 81 MG PO CHEW
324.0000 mg | CHEWABLE_TABLET | Freq: Once | ORAL | Status: AC
Start: 2015-03-26 — End: 2015-03-26
  Administered 2015-03-26: 324 mg via ORAL
  Filled 2015-03-26: qty 4

## 2015-03-26 MED ORDER — SODIUM CHLORIDE 0.9 % IV SOLN
Freq: Once | INTRAVENOUS | Status: AC
  Administered 2015-03-26: 12:00:00 via INTRAVENOUS

## 2015-03-26 NOTE — ED Notes (Signed)
Pt reports upon waking up this am having dizziness,generalized weakness. Pt also reports blurred vision and chest pain. Pt reports dizziness and blurred vision is worse with movement of head. Pt reports went to bed this am around 1am. Pt alert and oriented. Speech clear. Gait steady in triage.

## 2015-03-26 NOTE — ED Provider Notes (Signed)
CSN: [id number]     Arrival date & time 03/26/15  1033 History   First MD Initiated Contact with Patient 03/26/15 1037     Chief Complaint  Patient presents with  . Weakness     (Consider location/radiation/quality/duration/timing/severity/associated sxs/prior Treatment) Patient is a 54 y.o. female presenting with dizziness. The history is provided by the patient. No language interpreter was used.  Dizziness Quality:  Lightheadedness and head spinning Severity:  Moderate Onset quality:  Gradual Duration:  1 day Timing:  Constant Progression:  Worsening Chronicity:  New Context: not with physical activity   Relieved by:  Nothing Worsened by:  Nothing Ineffective treatments:  None tried Associated symptoms: chest pain and nausea   Risk factors: hx of vertigo   Pt reports she was dizzy this am.  Pt reports she felt like this when she had inner ear problems in the past.   Pt reports she also felt weak in both of her legs.   Pt reports she then had an episode of chest pain that lasted for several minutes.  Pt reports pain was in the area of her sternum.  Pt is currently pain freee  Past Medical History  Diagnosis Date  . Hypertension   . Thyroid activity decreased   . Anemia   . Asthma   . Irritable bowel syndrome   . Obesity   . Gallstones   . Diverticulosis   . Hyperplastic colon polyp    Past Surgical History  Procedure Laterality Date  . Cholecystectomy  1999  . Abdominal hysterectomy  2003  . Gastric bypass  2001  . Lt ovary and fallopian tube  2006  . Excess skin removed  2004    abdominoplasty  . Sinus endoscope    . Ethmoidectomy    . Colonoscopy  1999  . Umbilical hernia repair    . Colonoscopy with propofol N/A 11/21/2014    Procedure: COLONOSCOPY WITH PROPOFOL;  Surgeon: Jerene Bears, MD;  Location: [facility];  Service: Gastroenterology;  Laterality: N/A;  . Esophagogastroduodenoscopy N/A 11/21/2014    Procedure: ESOPHAGOGASTRODUODENOSCOPY (EGD);  Surgeon:  Jerene Bears, MD;  Location: [facility];  Service: Gastroenterology;  Laterality: N/A;   Family History  Problem Relation Age of Onset  . Colon cancer Neg Hx   . Colon polyps Mother   . Colon polyps Brother   . Diabetes Maternal Grandmother    Social History  Substance Use Topics  . Smoking status: Never Smoker   . Smokeless tobacco: Never Used  . Alcohol Use: No   OB History    No data available     Review of Systems  HENT: Negative for ear pain.   Cardiovascular: Positive for chest pain.  Gastrointestinal: Positive for nausea.  Musculoskeletal: Positive for arthralgias. Negative for back pain and gait problem.  Neurological: Positive for dizziness.  All other systems reviewed and are negative.     Allergies  Sulfa antibiotics  Home Medications   Prior to Admission medications   Medication Sig Start Date End Date Taking? Authorizing Provider  acetaminophen (TYLENOL) 500 MG tablet Take 1,000 mg by mouth every 6 (six) hours as needed for moderate pain.   Yes Historical Provider, MD  albuterol (PROVENTIL HFA;VENTOLIN HFA) 108 (90 BASE) MCG/ACT inhaler Inhale 1 puff into the lungs every 6 (six) hours as needed for wheezing or shortness of breath.   Yes Historical Provider, MD  amLODipine (NORVASC) 5 MG tablet Take 5 mg by mouth every morning.  Yes Historical Provider, MD  hydrochlorothiazide (HYDRODIURIL) 25 MG tablet Take 25 mg by mouth every morning.    Yes Historical Provider, MD  hyoscyamine (LEVSIN/SL) 0.125 MG SL tablet Place 1-2 tablets (0.125-0.25 mg total) under the tongue every 4 (four) hours as needed. Patient taking differently: Place 0.125-0.25 mg under the tongue every 4 (four) hours as needed (stomach).  11/21/14  Yes Jerene Bears, MD  ibuprofen (ADVIL,MOTRIN) 200 MG tablet Take 800 mg by mouth every 6 (six) hours as needed for moderate pain.   Yes Historical Provider, MD  levothyroxine (SYNTHROID, LEVOTHROID) 100 MCG tablet Take 100 mcg by mouth daily  before breakfast.   Yes Historical Provider, MD  meloxicam (MOBIC) 15 MG tablet Take 15 mg by mouth daily as needed for pain.    Yes Historical Provider, MD   BP 129/81 mmHg  Pulse 67  Temp(Src) 97.7 F (36.5 C) (Oral)  Resp 20  Ht 5\' 4"  (1.626 m)  Wt 300 lb (136.079 kg)  BMI 51.47 kg/m2  SpO2 100% Physical Exam  Constitutional: She is oriented to person, place, and time. She appears well-developed and well-nourished.  HENT:  Head: Normocephalic and atraumatic.  Right Ear: External ear normal.  Mouth/Throat: Oropharynx is clear and moist.  Eyes: Conjunctivae and EOM are normal. Pupils are equal, round, and reactive to light.  Neck: Normal range of motion.  Cardiovascular: Normal rate, regular rhythm and normal heart sounds.   Pulmonary/Chest: Effort normal.  Abdominal: Soft. She exhibits no distension.  Musculoskeletal: Normal range of motion.  Neurological: She is alert and oriented to person, place, and time.  Skin: Skin is warm.  Psychiatric: She has a normal mood and affect.  Nursing note and vitals reviewed.   ED Course  Procedures (including critical care time) Labs Review Labs Reviewed  COMPREHENSIVE METABOLIC PANEL - Abnormal; Notable for the following:    Potassium 3.1 (*)    Chloride 98 (*)    Glucose, Bld 104 (*)    Calcium 8.7 (*)    ALT 13 (*)    All other components within normal limits  URINALYSIS, ROUTINE W REFLEX MICROSCOPIC (NOT AT [facility]) - Abnormal; Notable for the following:    Hgb urine dipstick TRACE (*)    All other components within normal limits  CBC WITH DIFFERENTIAL/PLATELET  TROPONIN I  URINE MICROSCOPIC-ADD ON    Imaging Review Ct Head Wo Contrast  03/26/2015   CLINICAL DATA:  Onset of dizziness this a.m. Generalize weakness and blurred vision.  EXAM: CT HEAD WITHOUT CONTRAST  TECHNIQUE: Contiguous axial images were obtained from the base of the skull through the vertex without intravenous contrast.  COMPARISON:  None.  FINDINGS: The  ventricles are in the midline without mass effect or shift. They are normal in size configuration. No extra-axial fluid collections are identified. A small choroid fissure cyst is noted on the right. No CT findings for acute intracranial process such as hemorrhage or infarction. No mass lesions. The brainstem and cerebellum are grossly normal.  The paranasal sinuses and mastoid air cells are clear except for some debris in the left half of the sphenoid sinus. No acute bony findings or bone lesions. The globes are intact.  IMPRESSION: No acute intracranial findings.   Electronically Signed   By: Marijo Sanes M.D.   On: 03/26/2015 12:10   I have personally reviewed and evaluated these images and lab results as part of my medical decision-making.   EKG Interpretation   Date/Time:  Wednesday March 26 2015 10:48:42 EDT Ventricular Rate:  69 PR Interval:  162 QRS Duration: 100 QT Interval:  415 QTC Calculation: 445 R Axis:   6 Text Interpretation:  Sinus rhythm Low voltage, precordial leads Abnormal  R-wave progression, early transition Sinus rhythm Artifact Abnormal ekg  Confirmed by Carmin Muskrat  MD ([id number]) on 03/26/2015 10:54:39 AM      MDM  Ct head is normal, ekg is normal.   Dr. Vanita Panda in to see pt.  Pt has  A slightly low potassium.  Pt is given asa.  antivert and I will give 40 of potassium po.    Final diagnoses:  Vertigo  Hypokalemia    Pt advised to see her Md for recheck Rx for antivert and potasium     Fransico Meadow, PA-C 03/27/15 1402  Carmin Muskrat, MD 03/30/15 [phone]

## 2015-03-26 NOTE — ED Notes (Signed)
Pt made aware to return if symptoms worsen or if any life threatening symptoms occur.   

## 2015-03-27 NOTE — Discharge Instructions (Signed)
Benign Positional Vertigo Vertigo means you feel like you or your surroundings are moving when they are not. Benign positional vertigo is the most common form of vertigo. Benign means that the cause of your condition is not serious. Benign positional vertigo is more common in older adults. CAUSES  Benign positional vertigo is the result of an upset in the labyrinth system. This is an area in the middle ear that helps control your balance. This may be caused by a viral infection, head injury, or repetitive motion. However, often no specific cause is found. SYMPTOMS  Symptoms of benign positional vertigo occur when you move your head or eyes in different directions. Some of the symptoms may include:  Loss of balance and falls.  Vomiting.  Blurred vision.  Dizziness.  Nausea.  Involuntary eye movements (nystagmus). DIAGNOSIS  Benign positional vertigo is usually diagnosed by physical exam. If the specific cause of your benign positional vertigo is unknown, your caregiver may perform imaging tests, such as magnetic resonance imaging (MRI) or computed tomography (CT). TREATMENT  Your caregiver may recommend movements or procedures to correct the benign positional vertigo. Medicines such as meclizine, benzodiazepines, and medicines for nausea may be used to treat your symptoms. In rare cases, if your symptoms are caused by certain conditions that affect the inner ear, you may need surgery. HOME CARE INSTRUCTIONS   Follow your caregiver's instructions.  Move slowly. Do not make sudden body or head movements.  Avoid driving.  Avoid operating heavy machinery.  Avoid performing any tasks that would be dangerous to you or others during a vertigo episode.  Drink enough fluids to keep your urine clear or pale yellow. SEEK IMMEDIATE MEDICAL CARE IF:   You develop problems with walking, weakness, numbness, or using your arms, hands, or legs.  You have difficulty speaking.  You develop  severe headaches.  Your nausea or vomiting continues or gets worse.  You develop visual changes.  Your family or friends notice any behavioral changes.  Your condition gets worse.  You have a fever.  You develop a stiff neck or sensitivity to light. MAKE SURE YOU:   Understand these instructions.  Will watch your condition.  Will get help right away if you are not doing well or get worse. Document Released: 04/26/2006 Document Revised: 10/11/2011 Document Reviewed: 04/08/2011 [employer] Patient Information 2015 [employer]. This information is not intended to replace advice given to you by your health care provider. Make sure you discuss any questions you have with your health care provider.    

## 2015-05-19 ENCOUNTER — Ambulatory Visit: Payer: 59 | Admitting: Internal Medicine

## 2016-08-06 IMAGING — CT CT HEAD W/O CM
1 series · 16 of 30 positions shown, 20 images · non-contrast
Comparison: None.

CLINICAL DATA: Onset of dizziness this a.m.. Generalize weakness
and blurred vision.

EXAM:
CT HEAD WITHOUT CONTRAST
TECHNIQUE: Contiguous axial images were obtained from the base of the skull
through the vertex without intravenous contrast.

[Series 2: headseq 4.8 h37s · axial · 0.43mm/px · z∈[+107,+262]mm · 16 of 36 slices shown, 20 images]
[im 2/36  brain]
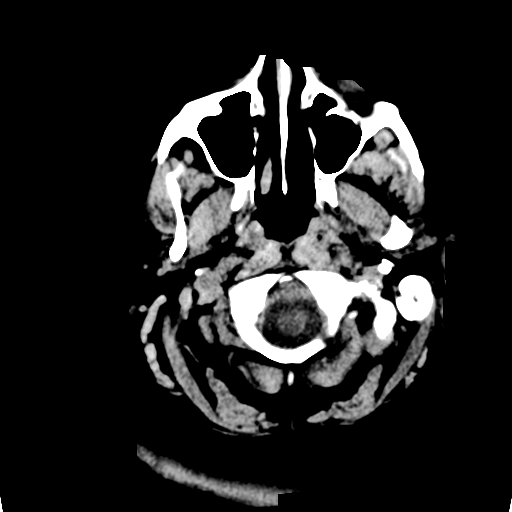
[im 2/36  bone]
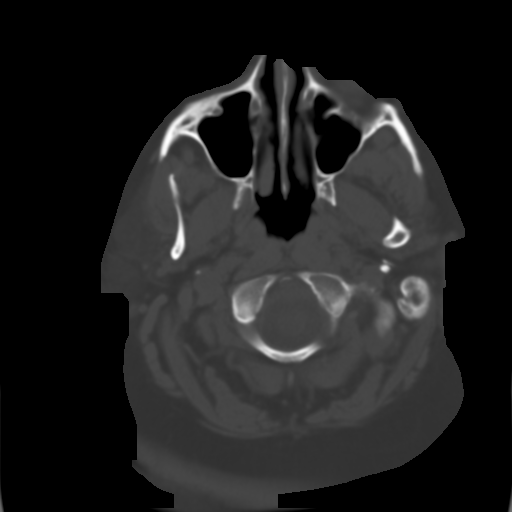
[im 4/36  brain]
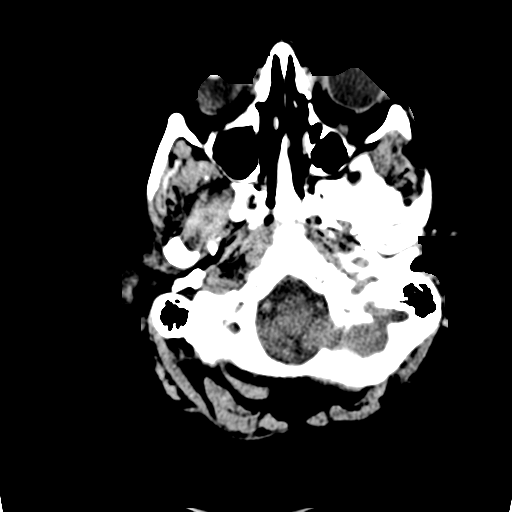
[im 7/36  brain]
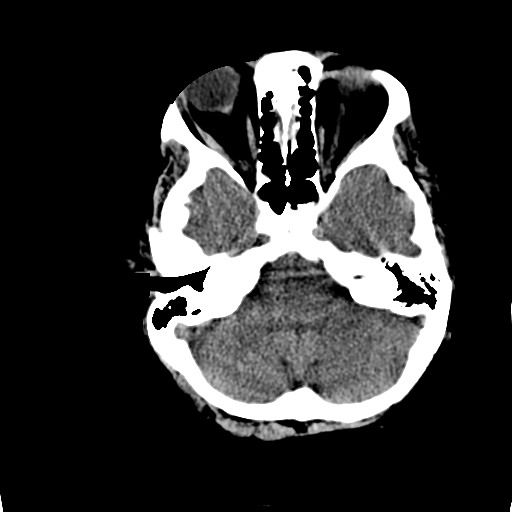
[im 9/36  brain]
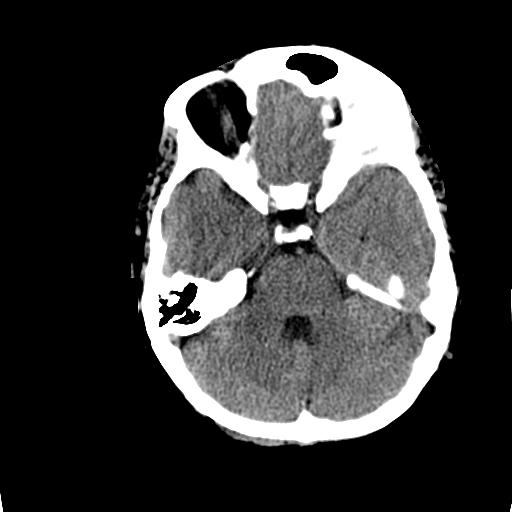
[im 10/36  brain]
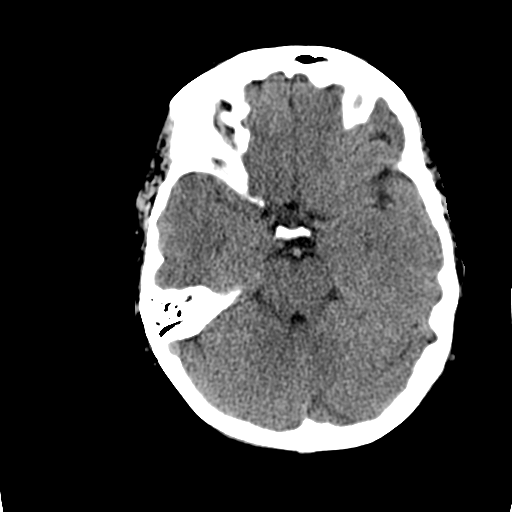
[im 10/36  bone]
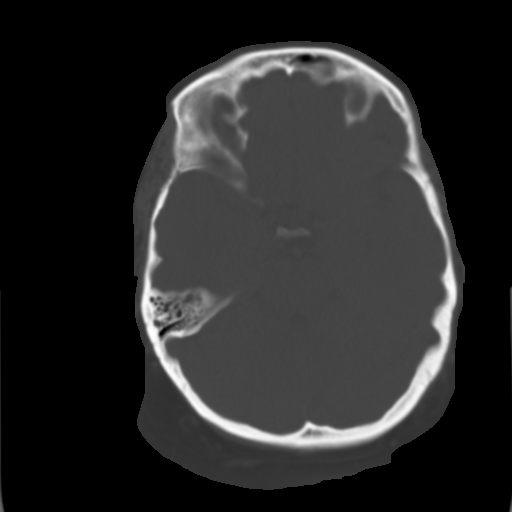
[im 13/36  brain]
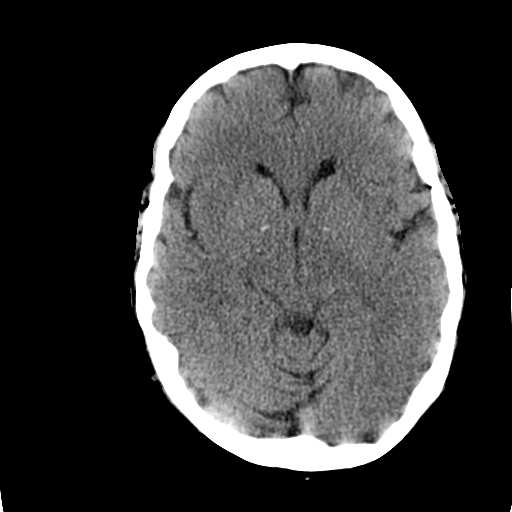
[im 15/36  brain]
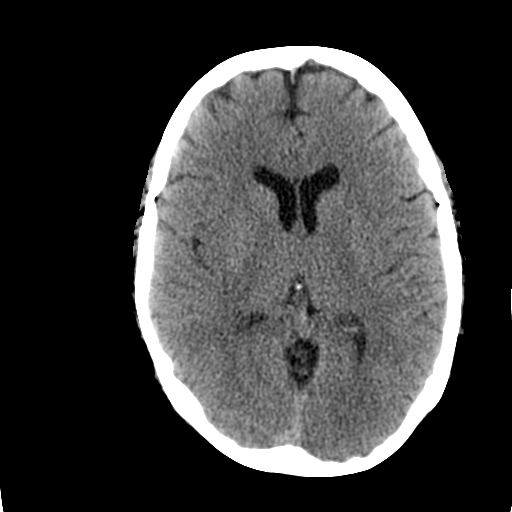
[im 17/36  brain]
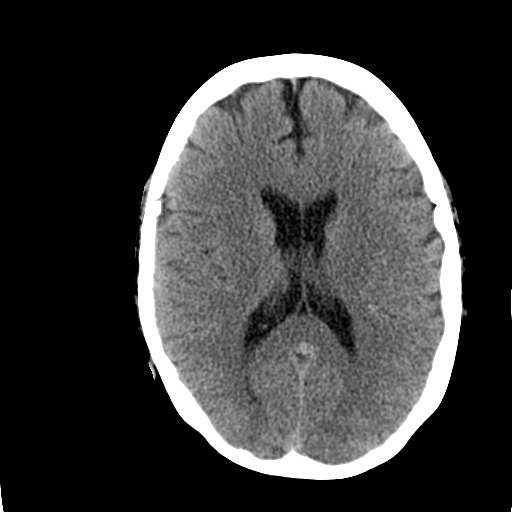
[im 19/36  brain]
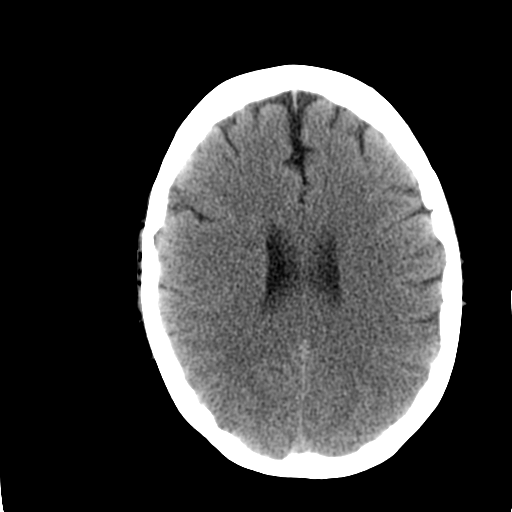
[im 19/36  bone]
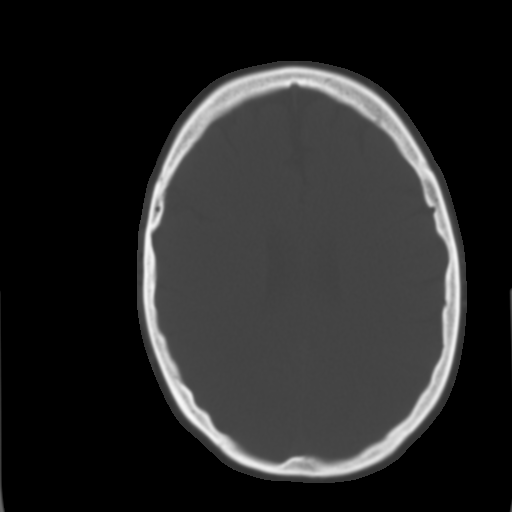
[im 21/36  brain]
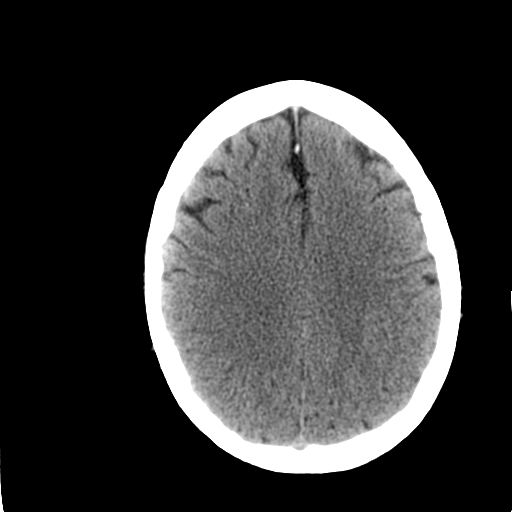
[im 23/36  brain]
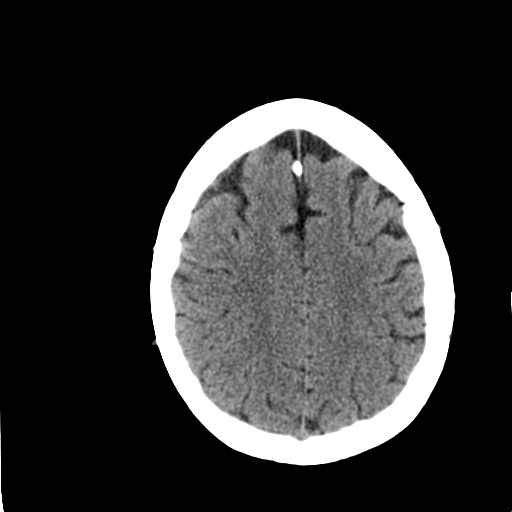
[im 26/36  brain]
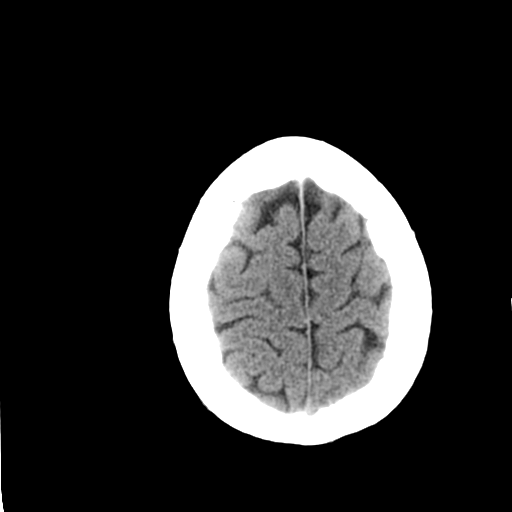
[im 27/36  brain]
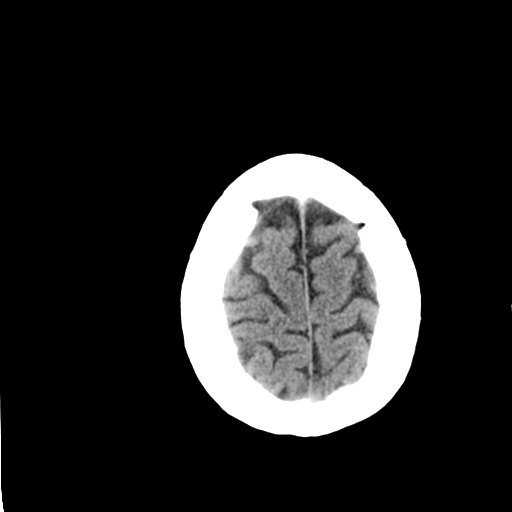
[im 27/36  bone]
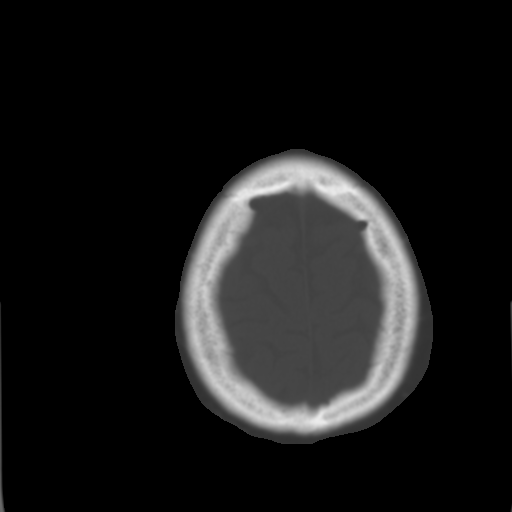
[im 29/36  brain]
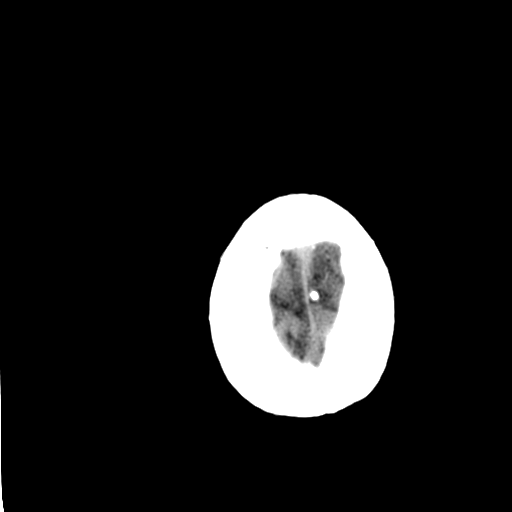
[im 32/36  brain]
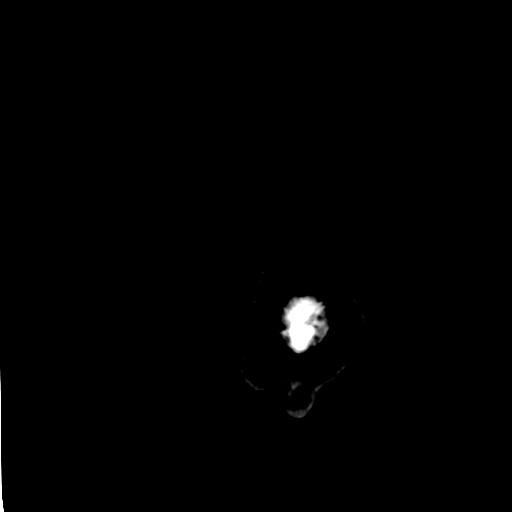
[im 34/36  brain]
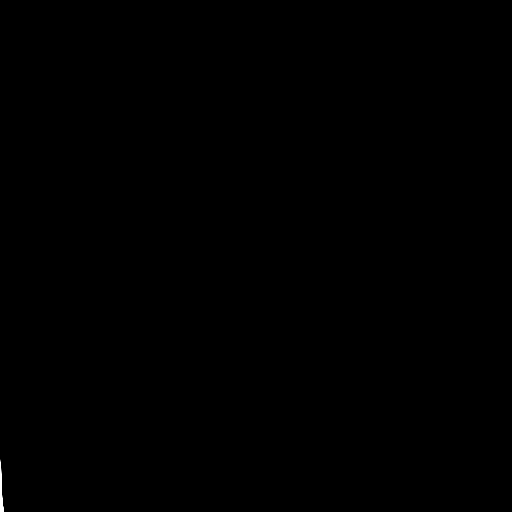

[16 of 30 positions shown; findings below may reference images not displayed]

FINDINGS: The ventricles are in the midline without mass effect or shift. They
are normal in size configuration. No extra-axial fluid collections
are identified. A small choroid fissure cyst is noted on the right.
No CT findings for acute intracranial process such as hemorrhage or
infarction. No mass lesions. The brainstem and cerebellum are
grossly normal.

The paranasal sinuses and mastoid air cells are clear except for
some debris in the left half of the sphenoid sinus. No acute bony
findings or bone lesions. The globes are intact.
IMPRESSION: No acute intracranial findings.

## 2019-09-24 ENCOUNTER — Encounter: Payer: Self-pay | Admitting: Gastroenterology

## 2019-12-25 ENCOUNTER — Encounter: Payer: 59 | Admitting: Internal Medicine
